# Patient Record
Sex: Female | Born: 1955 | Race: White | Hispanic: No | Smoking: Current every day smoker
Health system: Southern US, Community
[De-identification: ages and names within clinical notes are randomized; demographics above are authoritative.]

## PROBLEM LIST (undated history)

## (undated) DIAGNOSIS — I10 Essential (primary) hypertension: Secondary | ICD-10-CM

## (undated) HISTORY — PX: WRIST FRACTURE SURGERY: SHX121

---

## 2005-06-29 ENCOUNTER — Emergency Department: Payer: Self-pay | Admitting: Emergency Medicine

## 2006-05-02 ENCOUNTER — Ambulatory Visit: Payer: Self-pay

## 2007-07-03 ENCOUNTER — Ambulatory Visit: Payer: Self-pay

## 2008-05-13 ENCOUNTER — Ambulatory Visit: Payer: Self-pay

## 2008-07-07 ENCOUNTER — Ambulatory Visit: Payer: Self-pay

## 2010-05-31 ENCOUNTER — Ambulatory Visit: Payer: Self-pay | Admitting: Family Medicine

## 2011-08-29 ENCOUNTER — Ambulatory Visit: Payer: Self-pay

## 2012-02-06 DIAGNOSIS — I10 Essential (primary) hypertension: Secondary | ICD-10-CM | POA: Insufficient documentation

## 2012-08-29 ENCOUNTER — Ambulatory Visit: Payer: Self-pay | Admitting: Family Medicine

## 2013-09-12 ENCOUNTER — Ambulatory Visit: Payer: Self-pay | Admitting: Family Medicine

## 2014-08-24 ENCOUNTER — Other Ambulatory Visit: Payer: Self-pay | Admitting: Family Medicine

## 2014-08-24 DIAGNOSIS — Z1231 Encounter for screening mammogram for malignant neoplasm of breast: Secondary | ICD-10-CM

## 2014-10-05 ENCOUNTER — Ambulatory Visit
Admission: RE | Admit: 2014-10-05 | Discharge: 2014-10-05 | Disposition: A | Discharge status: Home / Self Care | Payer: No Typology Code available for payment source | Source: Ambulatory Visit | Attending: Family Medicine | Admitting: Family Medicine

## 2014-10-05 DIAGNOSIS — Z1231 Encounter for screening mammogram for malignant neoplasm of breast: Secondary | ICD-10-CM | POA: Insufficient documentation

## 2015-10-20 ENCOUNTER — Ambulatory Visit
Admission: RE | Admit: 2015-10-20 | Discharge: 2015-10-20 | Disposition: A | Discharge status: Home / Self Care | Payer: Self-pay | Source: Ambulatory Visit | Attending: Oncology | Admitting: Oncology

## 2015-10-20 ENCOUNTER — Encounter: Payer: Self-pay | Admitting: *Deleted

## 2015-10-20 ENCOUNTER — Ambulatory Visit: Payer: Self-pay | Attending: Oncology | Admitting: *Deleted

## 2015-10-20 ENCOUNTER — Encounter (INDEPENDENT_AMBULATORY_CARE_PROVIDER_SITE_OTHER): Payer: Self-pay

## 2015-10-20 VITALS — BP 143/94 | HR 73 | Temp 97.9°F | Ht 66.14 in | Wt 159.0 lb

## 2015-10-20 DIAGNOSIS — Z0000 Encounter for general adult medical examination without abnormal findings: Secondary | ICD-10-CM

## 2015-10-20 NOTE — Progress Notes (Signed)
Subjective:     Patient ID: Rebecca Pennington, female   DOB: 1955/05/21, 60 y.o.   MRN: [id number]  HPI   Review of Systems     Objective:   Physical Exam  Pulmonary/Chest: Right breast exhibits no inverted nipple, no mass, no nipple discharge, no skin change and no tenderness. Left breast exhibits no inverted nipple, no mass, no nipple discharge, no skin change and no tenderness. Breasts are symmetrical.    Abdominal: There is no splenomegaly or hepatomegaly.  Genitourinary: Rectal exam shows no mass and no tenderness. No labial fusion. There is no rash, tenderness, lesion or injury on the right labia. There is no rash, tenderness, lesion or injury on the left labia. Cervix exhibits no motion tenderness, no discharge and no friability. Right adnexum displays no mass, no tenderness and no fullness. Left adnexum displays no mass, no tenderness and no fullness. No erythema, tenderness or bleeding in the vagina. No foreign body in the vagina. No signs of injury around the vagina. No vaginal discharge found.       Assessment:     60 year old White [facility]  Patient states her last pap was in 2014.  We have no records of her results.  Clinical breast exam unremarkable.  Taught self breast awareness.  Specimen collected for pap smear since there are no results available.  Blood pressure elevated at  143/94.  She is to recheck her blood pressure at [facility] or [facility], and if remains higher than 140/90 she is to follow-up with her primary care provider.  Hand out on hypertention given to patient.atient has been screened for eligibility.  She does not have any insurance, Medicare or Medicaid.  She also meets financial eligibility.  Hand-out given on the Affordable Care Act.    Plan:     Screening mammogram ordered.  Specimen for pap sent to the lab.  Will follow-up per [facility] protocol.

## 2015-10-20 NOTE — Patient Instructions (Signed)
Human Papillomavirus Human papillomavirus (HPV) is the most common sexually transmitted infection (STI) and is highly contagious. HPV infections cause genital warts and cancers to the outlet of the womb (cervix), birth canal (vagina), opening of the birth canal (vulva), and anus. There are over 100 types of HPV. Unless wartlike lesions are present in the throat or there are genital warts that you can see or feel, HPV usually does not cause symptoms. It is possible to be infected for long periods and pass it on to others without knowing it. CAUSES  HPV is spread from person to person through sexual contact. This includes oral, vaginal, or anal sex. RISK FACTORS  Having unprotected sex. HPV can be spread by oral, vaginal, or anal sex.  Having several sex partners.  Having a sex partner who has other sex partners.  Having or having had another sexually transmitted infection. SIGNS AND SYMPTOMS  Most people carrying HPV do not have any symptoms. If symptoms are present, symptoms may include:  Wartlike lesions in the throat (from having oral sex).  Warts in the infected skin or mucous membranes.  Genital warts that may itch, burn, or bleed.  Genital warts that may be painful or bleed during sexual intercourse. DIAGNOSIS  If wartlike lesions are present in the throat or genital warts are present, your health care provider can usually diagnose HPV by physical examination.   Genital warts are easily seen with the naked eye.  Currently, there is no FDA-approved test to detect HPV in males.  In females, a Pap test can show cells that are infected with HPV.  In females, a scope can be used to view the cervix (colposcopy). A colposcopy can be performed if the pelvic exam or Pap test is abnormal. A sample of tissue may be removed (biopsy) during the colposcopy. TREATMENT  There is no treatment for the virus itself. However, there are treatments for the health problems and symptoms HPV can cause.  Your health care provider will follow you closely after you are treated. This is because the HPV can come back and may need treatment again. Treatment of HPV may include:   Medicines, which may be injected or applied in a cream, lotion, or gel form.  Use of a probe to apply extreme cold (cryotherapy).  Application of an intense beam of light (laser treatment).  Use of a probe to apply extreme heat (electrocautery).  Surgery. HOME CARE INSTRUCTIONS   Take medicines only as directed by your health care provider.  Use over-the-counter creams for itching or irritation as directed by your health care provider.  Keep all follow-up visits as directed by your health care provider. This is important.  Do not touch or scratch the warts.  Do not treat genital warts with medicines used for treating hand warts.  Do not have sex while you are being treated.  Do not douche or use tampons during treatment of HPV.  Tell your sex partner about your infection because he or she may also need treatment.  If you become pregnant, tell your health care provider that you have had HPV. Your health care provider will monitor you closely during pregnancy to be sure your baby is safe.  After treatment, use condoms during sex to prevent future infections.  Have only one sex partner.  Have a sex partner who does not have other sex partners. PREVENTION   Talk to your health care provider about getting the HPV vaccines. These vaccines prevent some HPV infections and cancers.   It is recommended that the vaccine be given to males and females between the ages of 83 and 4 years old. It will not work if you already have HPV, and it is not recommended for pregnant women.  A Pap test is done to screen for cervical cancer in women.  The first Pap test should be done at age 77 years.  Between ages 67 and 65 years, Pap tests are repeated every 2 years.  Beginning at age 35, you are advised to have a Pap test every  3 years as long as your past 3 Pap tests have been normal.  Some women have medical problems that increase the chance of getting cervical cancer. Talk to your health care provider about these problems. It is especially important to talk to your health care provider if a new problem develops soon after your last Pap test. In these cases, your health care provider may recommend more frequent screening and Pap tests.  The above recommendations are the same for women who have or have not gotten the vaccine for HPV.  If you had a hysterectomy for a problem that was not a cancer or a condition that could lead to cancer, then you no longer need Pap tests. However, even if you no longer need a Pap test, a regular exam is a good idea to make sure no other problems are starting.   If you are between the ages of 76 and 57 years and you have had normal Pap tests going back 10 years, you no longer need Pap tests. However, even if you no longer need a Pap test, a regular exam is a good idea to make sure no other problems are starting.  If you have had past treatment for cervical cancer or a condition that could lead to cancer, you need Pap tests and screening for cancer for at least 20 years after your treatment.  If Pap tests have been discontinued, risk factors (such as a new sexual partner)need to be reassessed to determine if screening should be resumed.  Some women may need screenings more often if they are at high risk for cervical cancer. SEEK MEDICAL CARE IF:   The treated skin becomes red, swollen, or painful.  You have a fever.  You feel generally ill.  You feel lumps or pimple-like projections in and around your genital area.  You develop bleeding of the vagina or the treatment area.  You have painful sexual intercourse. MAKE SURE YOU:   Understand these instructions.  Will watch your condition.  Will get help if you are not doing well or get worse.   This information is not  intended to replace advice given to you by your health care provider. Make sure you discuss any questions you have with your health care provider.   Document Released: 04/01/2003 Document Revised: 01/30/2014 Document Reviewed: 04/16/2013 Elsevier Interactive Patient Education 2016 [employer].    Hypertension Hypertension, commonly called high blood pressure, is when the force of blood pumping through your arteries is too strong. Your arteries are the blood vessels that carry blood from your heart throughout your body. A blood pressure reading consists of a higher number over a lower number, such as 110/72. The higher number (systolic) is the pressure inside your arteries when your heart pumps. The lower number (diastolic) is the pressure inside your arteries when your heart relaxes. Ideally you want your blood pressure below 120/80. Hypertension forces your heart to work harder to pump blood. Your  arteries may become narrow or stiff. Having untreated or uncontrolled hypertension can cause heart attack, stroke, kidney disease, and other problems. RISK FACTORS Some risk factors for high blood pressure are controllable. Others are not.  Risk factors you cannot control include:   Race. You may be at higher risk if you are African American.  Age. Risk increases with age.  Gender. Men are at higher risk than women before age 58 years. After age 58, women are at higher risk than men. Risk factors you can control include:  Not getting enough exercise or physical activity.  Being overweight.  Getting too much fat, sugar, calories, or salt in your diet.  Drinking too much alcohol. SIGNS AND SYMPTOMS Hypertension does not usually cause signs or symptoms. Extremely high blood pressure (hypertensive crisis) may cause headache, anxiety, shortness of breath, and nosebleed. DIAGNOSIS To check if you have hypertension, your health care provider will measure your blood pressure while you are seated,  with your arm held at the level of your heart. It should be measured at least twice using the same arm. Certain conditions can cause a difference in blood pressure between your right and left arms. A blood pressure reading that is higher than normal on one occasion does not mean that you need treatment. If it is not clear whether you have high blood pressure, you may be asked to return on a different day to have your blood pressure checked again. Or, you may be asked to monitor your blood pressure at home for 1 or more weeks. TREATMENT Treating high blood pressure includes making lifestyle changes and possibly taking medicine. Living a healthy lifestyle can help lower high blood pressure. You may need to change some of your habits. Lifestyle changes may include:  Following the DASH diet. This diet is high in fruits, vegetables, and whole grains. It is low in salt, red meat, and added sugars.  Keep your sodium intake below 2,300 mg per day.  Getting at least 30-45 minutes of aerobic exercise at least 4 times per week.  Losing weight if necessary.  Not smoking.  Limiting alcoholic beverages.  Learning ways to reduce stress. Your health care provider may prescribe medicine if lifestyle changes are not enough to get your blood pressure under control, and if one of the following is true:  You are 5318-58 years of age and your systolic blood pressure is above 140.  You are 58 years of age or older, and your systolic blood pressure is above 150.  Your diastolic blood pressure is above 90.  You have diabetes, and your systolic blood pressure is over 140 or your diastolic blood pressure is over 90.  You have kidney disease and your blood pressure is above 140/90.  You have heart disease and your blood pressure is above 140/90. Your personal target blood pressure may vary depending on your medical conditions, your age, and other factors. HOME CARE INSTRUCTIONS  Have your blood pressure  rechecked as directed by your health care provider.   Take medicines only as directed by your health care provider. Follow the directions carefully. Blood pressure medicines must be taken as prescribed. The medicine does not work as well when you skip doses. Skipping doses also puts you at risk for problems.  Do not smoke.   Monitor your blood pressure at home as directed by your health care provider. SEEK MEDICAL CARE IF:   You think you are having a reaction to medicines taken.  You have recurrent headaches  or feel dizzy.  You have swelling in your ankles.  You have trouble with your vision. SEEK IMMEDIATE MEDICAL CARE IF:  You develop a severe headache or confusion.  You have unusual weakness, numbness, or feel faint.  You have severe chest or abdominal pain.  You vomit repeatedly.  You have trouble breathing. MAKE SURE YOU:   Understand these instructions.  Will watch your condition.  Will get help right away if you are not doing well or get worse.   This information is not intended to replace advice given to you by your health care provider. Make sure you discuss any questions you have with your health care provider.   Document Released: 01/09/2005 Document Revised: 05/26/2014 Document Reviewed: 11/01/2012 Elsevier Interactive Patient Education [employer]

## 2015-10-22 LAB — PAP LB AND HPV HIGH-RISK
HPV, high-risk: NEGATIVE
PAP SMEAR COMMENT: 0

## 2015-10-29 ENCOUNTER — Encounter: Payer: Self-pay | Admitting: *Deleted

## 2015-10-29 NOTE — Progress Notes (Signed)
Letter mailed to inform patient of her normal mammogram and pap smear.  Follow up with screening mammogram in one year and next pap in 5 years.  HSIS to Christy. 

## 2016-11-13 ENCOUNTER — Ambulatory Visit
Admission: RE | Admit: 2016-11-13 | Discharge: 2016-11-13 | Disposition: A | Discharge status: Home / Self Care | Payer: Self-pay | Source: Ambulatory Visit | Attending: Oncology | Admitting: Oncology

## 2016-11-13 ENCOUNTER — Encounter: Payer: Self-pay | Admitting: *Deleted

## 2016-11-13 ENCOUNTER — Ambulatory Visit: Payer: Self-pay | Attending: Oncology | Admitting: *Deleted

## 2016-11-13 ENCOUNTER — Encounter (INDEPENDENT_AMBULATORY_CARE_PROVIDER_SITE_OTHER): Payer: Self-pay

## 2016-11-13 VITALS — BP 120/87 | HR 80 | Temp 97.8°F | Ht 67.0 in | Wt 164.0 lb

## 2016-11-13 DIAGNOSIS — Z0000 Encounter for general adult medical examination without abnormal findings: Secondary | ICD-10-CM

## 2016-11-13 NOTE — Patient Instructions (Signed)
Gave patient hand-out, Women Staying Healthy, Active and Well from [facility], with education on breast health, pap smears, heart and colon health. 

## 2016-11-13 NOTE — Progress Notes (Signed)
Subjective:     Patient ID: Rowan BlaseElizabeth M Warrior, female   DOB: 11-06-1955, 61 y.o.   MRN: [id number]  HPI   Review of Systems     Objective:   Physical Exam  Pulmonary/Chest: Right breast exhibits no inverted nipple, no mass, no nipple discharge, no skin change and no tenderness. Left breast exhibits no inverted nipple, no mass, no nipple discharge, no skin change and no tenderness. Breasts are asymmetrical.    Right breast 1 cup size larger than the left       Assessment:     61 year [facility] White [facility]  Clinical breast exam unremarkable.  Taught self breast awareness.  Last pap on 10/20/15 was -/-.  Next pap due in 2022.  Patient has been screened for eligibility.  She does not have any insurance, Medicare or Medicaid.  She also meets financial eligibility.  Hand-out given on the Affordable Care Act.    Plan:     Screening mammogram ordered.  Will follow-up per [facility] protocol.

## 2016-11-29 ENCOUNTER — Encounter: Payer: Self-pay | Admitting: *Deleted

## 2016-11-29 NOTE — Progress Notes (Signed)
Letter mailed from [facility] to inform patient of her normal mammogram results.  Patient is to follow-up with annual screening in one year.  HSIS to Christy. 

## 2017-12-17 ENCOUNTER — Ambulatory Visit
Admission: RE | Admit: 2017-12-17 | Discharge: 2017-12-17 | Disposition: A | Discharge status: Home / Self Care | Payer: Self-pay | Source: Ambulatory Visit | Attending: Oncology | Admitting: Oncology

## 2017-12-17 ENCOUNTER — Encounter (INDEPENDENT_AMBULATORY_CARE_PROVIDER_SITE_OTHER): Payer: Self-pay

## 2017-12-17 ENCOUNTER — Ambulatory Visit: Payer: Self-pay | Attending: Oncology

## 2017-12-17 VITALS — BP 132/91 | HR 99 | Temp 97.8°F | Ht 67.0 in | Wt 175.0 lb

## 2017-12-17 DIAGNOSIS — Z0000 Encounter for general adult medical examination without abnormal findings: Secondary | ICD-10-CM | POA: Insufficient documentation

## 2017-12-17 NOTE — Progress Notes (Signed)
  Subjective:     Patient ID: Rowan BlaseElizabeth M Goodreau, female   DOB: 1955-12-30, 62 y.o.   MRN: [id number]  HPI   Review of Systems     Objective:   Physical Exam  Pulmonary/Chest: Right breast exhibits no inverted nipple, no mass, no nipple discharge, no skin change and no tenderness. Left breast exhibits no inverted nipple, no mass, no nipple discharge, no skin change and no tenderness. Breasts are asymmetrical.  Right breast larger than left       Assessment:     62 year old patient presents for [facility] clinic visit.  Patient screened, and meets [facility] eligibility.  Patient does not have insurance, Medicare or Medicaid.  Handout given on Affordable Care Act.  Instructed patient on breast self awareness using teach back method.  Clinical breast exam unremarkable.  No mass or lump palpated.  Recheck blood pressure 132/91.  Patient will monitor.   States she is going for physical at [facility], and will discuss with provider.  Reports she has had a stressful morning at work, having to learn a new process.     Plan:     Sent for bilateral screening mammogram.

## 2017-12-18 NOTE — Progress Notes (Signed)
Letter mailed from [facility] to notify of normal mammogram results.  Patient to return in one year for annual screening.  Copy to HSIS. 

## 2019-04-28 ENCOUNTER — Ambulatory Visit: Payer: Self-pay | Attending: Internal Medicine

## 2019-04-28 DIAGNOSIS — Z23 Encounter for immunization: Secondary | ICD-10-CM

## 2019-04-28 NOTE — Progress Notes (Signed)
   Covid-19 Vaccination Clinic  Name:  Rebecca Pennington    MRN: [id number] DOB: Oct 09, 1955  04/28/2019  Rebecca Pennington was observed post Covid-19 immunization for 15 minutes without incident. She was provided with Vaccine Information Sheet and instruction to access the V-Safe system.   Rebecca Pennington was instructed to call 911 with any severe reactions post vaccine: Marland Kitchen Difficulty breathing  . Swelling of face and throat  . A fast heartbeat  . A bad rash all over body  . Dizziness and weakness   Immunizations Administered    Name Date Dose VIS Date Route   Pfizer COVID-19 Vaccine 04/28/2019 11:42 AM 0.3 mL 01/03/2019 Intramuscular   Manufacturer: [employer], [employer]   Lot: [id number]   NDC: [address]-7

## 2019-05-21 ENCOUNTER — Ambulatory Visit: Payer: Self-pay | Attending: Internal Medicine

## 2019-05-21 DIAGNOSIS — Z23 Encounter for immunization: Secondary | ICD-10-CM

## 2019-05-21 NOTE — Progress Notes (Signed)
   Covid-19 Vaccination Clinic  Name:  Rebecca Pennington    MRN: [id number] DOB: [birth year]  05/21/2019  Rebecca Pennington was observed post Covid-19 immunization for 15 minutes without incident. She was provided with Vaccine Information Sheet and instruction to access the V-Safe system.   Rebecca Pennington was instructed to call 911 with any severe reactions post vaccine: Marland Kitchen Difficulty breathing  . Swelling of face and throat  . A fast heartbeat  . A bad rash all over body  . Dizziness and weakness   Immunizations Administered    Name Date Dose VIS Date Route   Pfizer COVID-19 Vaccine 05/21/2019 11:21 AM 0.3 mL 03/19/2018 Intramuscular   Manufacturer: [employer], [employer]   Lot: [id number]   NDC: [address]-0

## 2020-06-16 ENCOUNTER — Ambulatory Visit: Payer: Self-pay | Attending: Oncology | Admitting: *Deleted

## 2020-06-16 ENCOUNTER — Other Ambulatory Visit: Payer: Self-pay

## 2020-06-16 ENCOUNTER — Encounter: Payer: Self-pay | Admitting: *Deleted

## 2020-06-16 ENCOUNTER — Ambulatory Visit
Admission: RE | Admit: 2020-06-16 | Discharge: 2020-06-16 | Disposition: A | Discharge status: Home / Self Care | Payer: Self-pay | Source: Ambulatory Visit | Attending: Oncology | Admitting: Oncology

## 2020-06-16 VITALS — BP 139/83 | HR 72 | Temp 97.7°F | Resp 16 | Wt 150.2 lb

## 2020-06-16 DIAGNOSIS — Z0000 Encounter for general adult medical examination without abnormal findings: Secondary | ICD-10-CM | POA: Insufficient documentation

## 2020-06-16 NOTE — Progress Notes (Signed)
  Subjective:     Patient ID: Rebecca Pennington, female   DOB: 08-19-1955, 65 y.o.   MRN: [id number]  HPI   Review of Systems     Objective:   Physical Exam Chest:  Breasts:     Right: No swelling, bleeding, inverted nipple, mass, nipple discharge, skin change, tenderness, axillary adenopathy or supraclavicular adenopathy.     Left: No swelling, bleeding, inverted nipple, mass, nipple discharge, skin change, tenderness, axillary adenopathy or supraclavicular adenopathy.    Abdominal:     Palpations: There is no hepatomegaly or splenomegaly.  Genitourinary:    Exam position: Lithotomy position.     Labia:        Right: No rash, tenderness, lesion or injury.        Left: No rash, tenderness, lesion or injury.      Urethra: No prolapse or urethral pain.     Vagina: No signs of injury and foreign body. No vaginal discharge, erythema, tenderness, bleeding, lesions or prolapsed vaginal walls.     Cervix: No cervical motion tenderness, discharge, friability, lesion, erythema, cervical bleeding or eversion.     Uterus: Not deviated, not enlarged, not fixed, not tender and no uterine prolapse.      Adnexa:        Right: No mass.         Left: No mass.       Rectum: No mass.  Lymphadenopathy:     Upper Body:     Right upper body: No supraclavicular or axillary adenopathy.     Left upper body: No supraclavicular or axillary adenopathy.        Assessment:     65 year old female returns to [facility] for annual screening.  Clinical breast exam unremarkable.  Taught self breast awareness.  Specimen collected for pap smear without difficulty.  Patient has been screened for eligibility.  She does not have any insurance, Medicare or Medicaid.  She also meets financial eligibility.   Risk Assessment    Risk Scores      06/16/2020 12/17/2017   Last edited by: Scarlett Presto, RN [place], Freada Bergeron, CMA   5-year risk: 1.7 % 1.6 %   Lifetime risk: 7.4 % 8.1 %            Plan:     Screening  mammogram ordered.  Specimen for pap sent to the lab.  Will follow up per [facility] protocol.

## 2020-06-16 NOTE — Patient Instructions (Signed)
Gave patient hand-out, Women Staying Healthy, Active and Well from [facility], with education on breast health, pap smears, heart and colon health. 

## 2020-06-19 LAB — IGP, APTIMA HPV: HPV Aptima: NEGATIVE

## 2020-06-23 ENCOUNTER — Encounter: Payer: Self-pay | Admitting: *Deleted

## 2020-12-16 ENCOUNTER — Emergency Department: Payer: Self-pay

## 2020-12-16 ENCOUNTER — Emergency Department
Admission: EM | Admit: 2020-12-16 | Discharge: 2020-12-16 | Disposition: A | Discharge status: Home / Self Care | Payer: Self-pay | Attending: Emergency Medicine | Admitting: Emergency Medicine

## 2020-12-16 ENCOUNTER — Other Ambulatory Visit: Payer: Self-pay

## 2020-12-16 ENCOUNTER — Encounter: Payer: Self-pay | Admitting: Emergency Medicine

## 2020-12-16 DIAGNOSIS — I10 Essential (primary) hypertension: Secondary | ICD-10-CM | POA: Insufficient documentation

## 2020-12-16 DIAGNOSIS — S82841A Displaced bimalleolar fracture of right lower leg, initial encounter for closed fracture: Secondary | ICD-10-CM | POA: Insufficient documentation

## 2020-12-16 DIAGNOSIS — F17210 Nicotine dependence, cigarettes, uncomplicated: Secondary | ICD-10-CM | POA: Insufficient documentation

## 2020-12-16 DIAGNOSIS — M25571 Pain in right ankle and joints of right foot: Secondary | ICD-10-CM | POA: Insufficient documentation

## 2020-12-16 DIAGNOSIS — W541XXA Struck by dog, initial encounter: Secondary | ICD-10-CM | POA: Insufficient documentation

## 2020-12-16 HISTORY — DX: Essential (primary) hypertension: I10

## 2020-12-16 MED ORDER — OXYCODONE-ACETAMINOPHEN 5-325 MG PO TABS
1.0000 | ORAL_TABLET | Freq: Once | ORAL | Status: AC
  Administered 2020-12-16: 1 via ORAL
  Filled 2020-12-16: qty 1

## 2020-12-16 MED ORDER — OXYCODONE-ACETAMINOPHEN 5-325 MG PO TABS: 1.0000 | ORAL_TABLET | ORAL | 0 refills | Status: DC | PRN

## 2020-12-16 MED ORDER — OXYCODONE-ACETAMINOPHEN 5-325 MG PO TABS: 1.0000 | ORAL_TABLET | ORAL | 0 refills | Status: AC | PRN

## 2020-12-16 NOTE — ED Provider Notes (Signed)
[facility] Emergency Department Provider Note   ____________________________________________   Event Date/Time   First MD Initiated Contact with Patient 12/16/20 1405     (approximate)  I have reviewed the triage vital signs and the nursing notes.   HISTORY  Chief Complaint Ankle Pain    HPI Rebecca Pennington is a 65 y.o. female with past medical history of hypertension who presents to the ED complaining of ankle pain.  Patient reports that she tripped and fell over her dog just prior to arrival, resulting in inversion of her right ankle.  She felt a pop and had immediate onset of severe pain, has been unable to bear weight on her right foot since then.  She denies hitting her head or losing consciousness, denies any injuries to her left lower extremity or her bilateral upper extremities.  She denies any prior injuries to this right ankle, has noticed significant swelling since the injury.  She does not take any blood thinners.        Past Medical History:  Diagnosis Date   Hypertension     There are no problems to display for this patient.   History reviewed. No pertinent surgical history.  Prior to Admission medications   Medication Sig Start Date End Date Taking? Authorizing Provider  oxyCODONE-acetaminophen (PERCOCET) 5-325 MG tablet Take 1 tablet by mouth every 4 (four) hours as needed for severe pain. 12/16/20 12/16/21  Chesley Noon, MD    Allergies Sulfa antibiotics  Family History  Problem Relation Age of Onset   Breast cancer Neg Hx     Social History Social History   Tobacco Use   Smoking status: Every Day    Types: Cigarettes   Smokeless tobacco: Never  Substance Use Topics   Alcohol use: Yes   Drug use: Not Currently    Review of Systems  Constitutional: No fever/chills Eyes: No visual changes. ENT: No sore throat. Cardiovascular: Denies chest pain. Respiratory: Denies shortness of breath. Gastrointestinal:  No abdominal pain.  No nausea, no vomiting.  No diarrhea.  No constipation. Genitourinary: Negative for dysuria. Musculoskeletal: Negative for back pain.  Positive for right ankle pain. Skin: Negative for rash. Neurological: Negative for headaches, focal weakness or numbness.  ____________________________________________   PHYSICAL EXAM:  VITAL SIGNS: ED Triage Vitals  Enc Vitals Group     BP 12/16/20 1305 117/66     Pulse Rate 12/16/20 1305 78     Resp 12/16/20 1305 16     Temp 12/16/20 1305 97.8 F (36.6 C)     Temp Source 12/16/20 1305 Oral     SpO2 12/16/20 1305 100 %     Weight 12/16/20 1257 150 lb 2.1 oz (68.1 kg)     Height 12/16/20 1257 5\' 7"  (1.702 m)     Head Circumference --      Peak Flow --      Pain Score 12/16/20 1257 5     Pain Loc --      Pain Edu? --      Excl. in GC? --     Constitutional: Alert and oriented. Eyes: Conjunctivae are normal. Head: Atraumatic. Nose: No congestion/rhinnorhea. Mouth/Throat: Mucous membranes are moist. Neck: Normal ROM Cardiovascular: Normal rate, regular rhythm. Grossly normal heart sounds.  2+ DP pulses bilaterally. Respiratory: Normal respiratory effort.  No retractions. Lungs CTAB. Gastrointestinal: Soft and nontender. No distention. Genitourinary: deferred Musculoskeletal: Edema and tenderness noted to right ankle, especially affecting the lateral malleolus, with no obvious deformity.  No tenderness to palpation over left foot.  No tenderness to palpation noted to bilateral knees, left ankle or foot.  No lacerations noted. Neurologic:  Normal speech and language. No gross focal neurologic deficits are appreciated. Skin:  Skin is warm, dry and intact. No rash noted. Psychiatric: Mood and affect are normal. Speech and behavior are normal.  ____________________________________________   LABS (all labs ordered are listed, but only abnormal results are displayed)  Labs Reviewed - No data to  display   PROCEDURES  Procedure(s) performed (including Critical Care):  .Splint Application  Date/Time: 12/16/2020 3:13 PM Performed by: Chesley Noon, MD Authorized by: Chesley Noon, MD   Consent:    Consent obtained:  Verbal   Consent given by:  Patient   Risks, benefits, and alternatives were discussed: yes     Risks discussed:  Discoloration, numbness, pain and swelling   Alternatives discussed:  Referral Universal protocol:    Patient identity confirmed:  Verbally with patient and arm band Pre-procedure details:    Distal neurologic exam:  Normal   Distal perfusion: distal pulses strong and brisk capillary refill   Procedure details:    Location:  Ankle   Ankle location:  R ankle   Splint type:  Ankle stirrup and short leg   Supplies:  Cotton padding, fiberglass and elastic bandage   Attestation: Splint applied and adjusted personally by me   Post-procedure details:    Distal neurologic exam:  Normal   Distal perfusion: brisk capillary refill     Procedure completion:  Tolerated well, no immediate complications   Post-procedure imaging: not applicable     ____________________________________________   INITIAL IMPRESSION / ASSESSMENT AND PLAN / ED COURSE      65 year old female with past medical history of hypertension presents to the ED after tripping and falling over her dog, now has significant pain and swelling to her right ankle.  She is neurovascularly intact to her distal right lower extremity, does have significant edema and ecchymosis to the area of her right malleolus with associated tenderness.  No tenderness noted to right foot.  X-rays of right ankle reviewed by me and show bimalleolar fracture with significant distal fibular fracture and a smaller medial malleolus fracture.  There is slight disruption of the ankle mortise.  X-rays reviewed with Dr. Joice Lofts of orthopedics, who recommends splinting in neutral position.  Given patient has minimal medical  problems, will be able to utilize crutches, and has help at home, she is appropriate for outpatient management and follow-up in orthopedic clinic.  Splint applied by me and patient is significantly more comfortable with this in place.  She is appropriate for outpatient management and follow-up with orthopedics, was provided with crutches and short course of pain medication.  She was counseled to return to the ED for new worsening symptoms, patient agrees with plan.      ____________________________________________   FINAL CLINICAL IMPRESSION(S) / ED DIAGNOSES  Final diagnoses:  Closed bimalleolar fracture of right ankle, initial encounter     ED Discharge Orders          Ordered    oxyCODONE-acetaminophen (PERCOCET) 5-325 MG tablet  Every 4 hours PRN,   Status:  Discontinued        12/16/20 1505    oxyCODONE-acetaminophen (PERCOCET) 5-325 MG tablet  Every 4 hours PRN        12/16/20 1509             Note:  This document was prepared using Dragon  voice recognition software and may include unintentional dictation errors.    Chesley Noon, MD 12/16/20 [phone]

## 2020-12-16 NOTE — ED Triage Notes (Signed)
Pt comes into the ED via POV c/o right ankle pain after tripping over her dog today.  Pt in NAD at this time with even and unlabored respirations.

## 2020-12-16 NOTE — ED Notes (Signed)
Ice pack applied to right ankle.

## 2020-12-20 DIAGNOSIS — S82841A Displaced bimalleolar fracture of right lower leg, initial encounter for closed fracture: Secondary | ICD-10-CM | POA: Insufficient documentation

## 2020-12-21 ENCOUNTER — Other Ambulatory Visit: Payer: Self-pay | Admitting: Surgery

## 2020-12-22 ENCOUNTER — Other Ambulatory Visit: Payer: Self-pay

## 2020-12-23 ENCOUNTER — Encounter: Admission: RE | Payer: Self-pay | Source: Home / Self Care

## 2020-12-23 ENCOUNTER — Ambulatory Visit: Admission: RE | Admit: 2020-12-23 | Payer: BLUE CROSS/BLUE SHIELD | Source: Home / Self Care | Admitting: Surgery

## 2020-12-23 SURGERY — OPEN REDUCTION INTERNAL FIXATION (ORIF) ANKLE FRACTURE
Anesthesia: Choice | Site: Ankle | Laterality: Right

## 2022-03-24 IMAGING — CR DG ANKLE COMPLETE 3+V*R*
1 series · 3 of 3 positions shown · non-contrast
Comparison: None.

CLINICAL DATA: Status post fall today.

EXAM:
RIGHT ANKLE - COMPLETE 3+ VIEW

[Series 1: dg ankle complete right · 0.14mm/px · 3 of 3 slices shown]
[im 1/3]
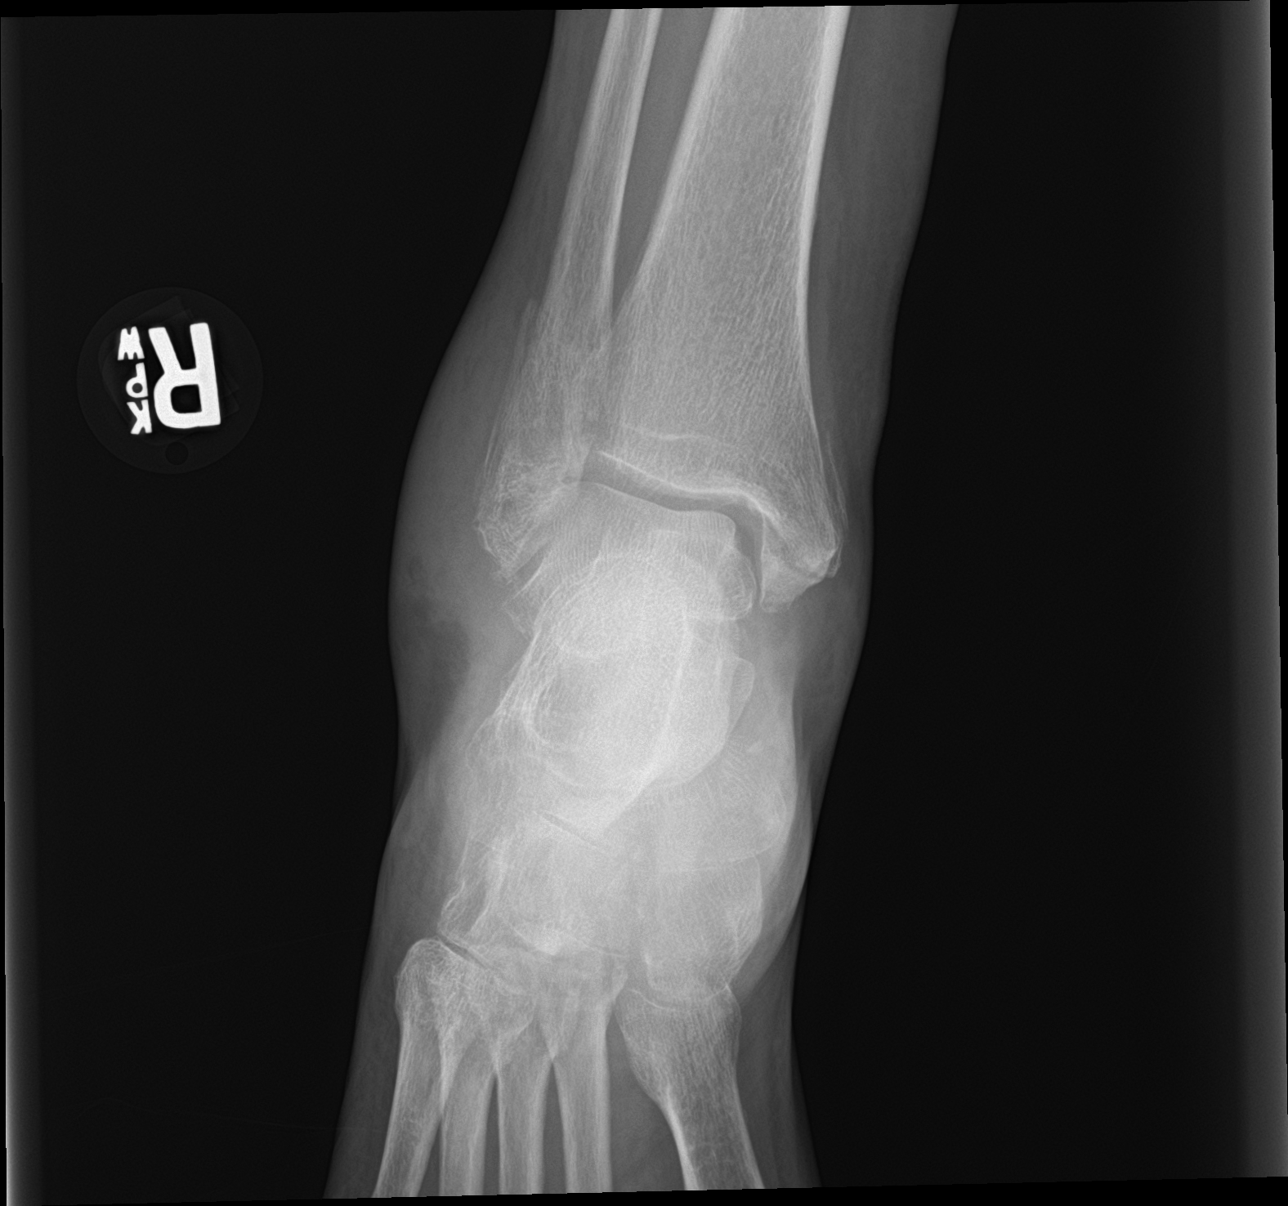
[im 2/3]
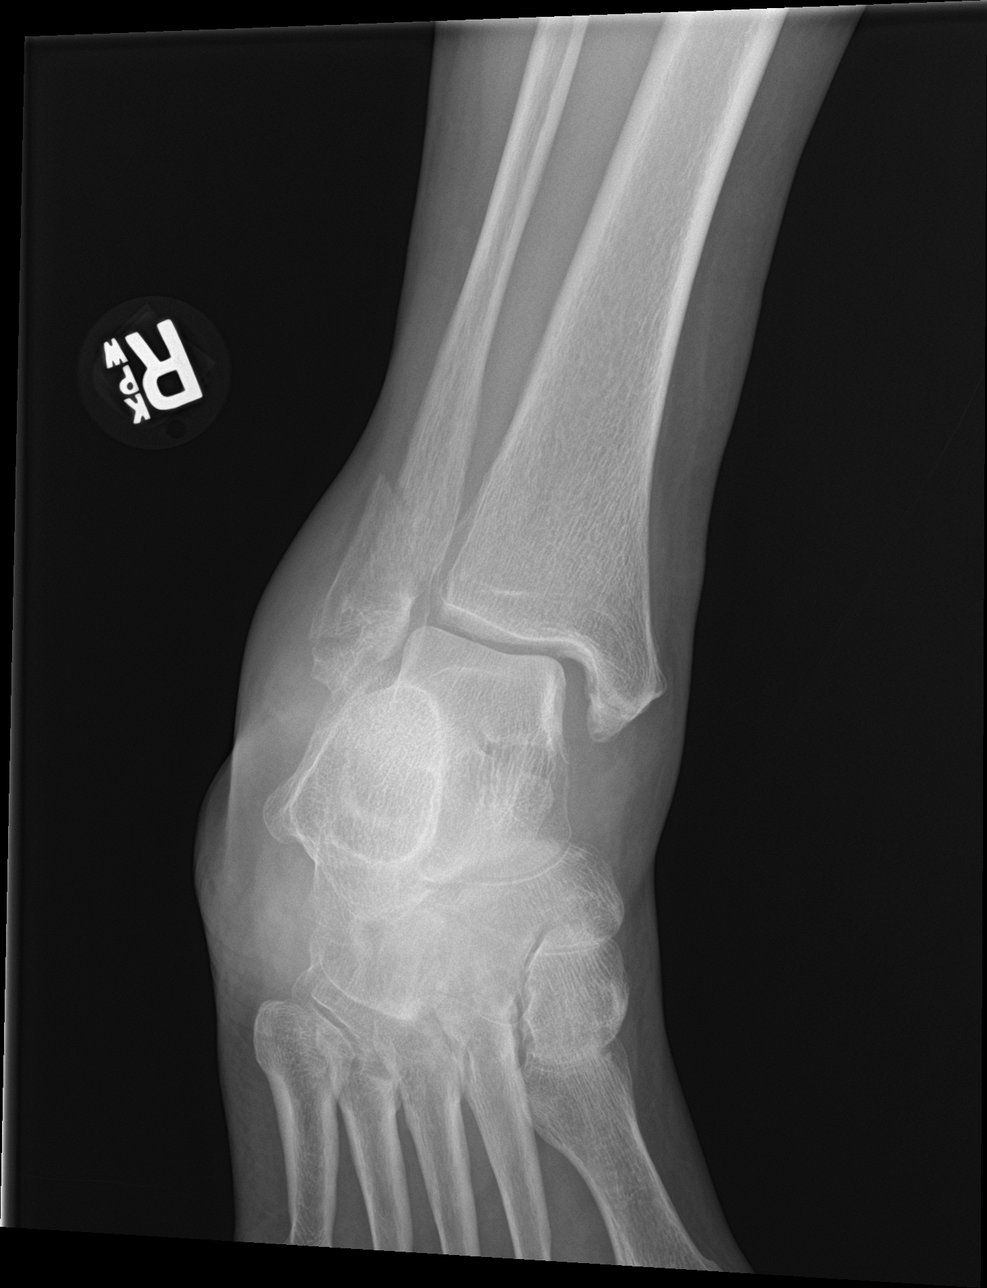
[im 3/3]
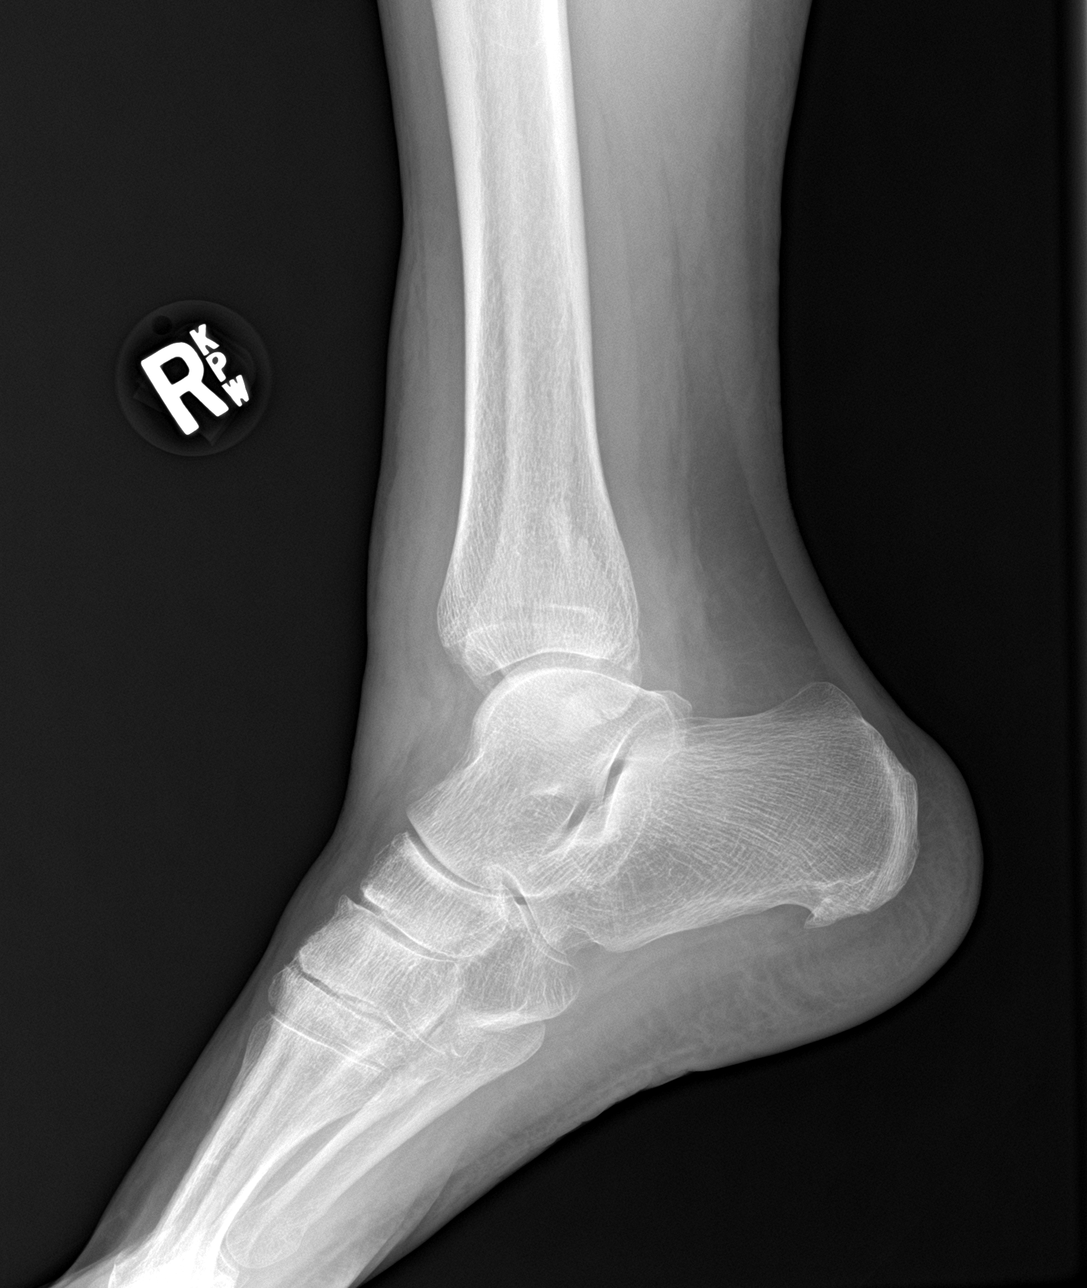

[3 of 3 positions shown; findings below may reference images not displayed]

FINDINGS: Comminuted displaced fracture of the distal fibula is identified.
Fracture of the medial malleolus is identified. There is ankle
mortise disruption. Small plantar calcaneal spur is noted. Soft
tissue noted around the ankle.
IMPRESSION: 1. Comminuted displaced fracture of the distal fibula.
2. Fracture of the medial malleolus.
3. Circumferential ankle mortise disruption.

## 2022-05-15 ENCOUNTER — Other Ambulatory Visit: Payer: Self-pay | Admitting: Physician Assistant

## 2022-05-15 DIAGNOSIS — Z1231 Encounter for screening mammogram for malignant neoplasm of breast: Secondary | ICD-10-CM

## 2022-06-07 ENCOUNTER — Ambulatory Visit
Admission: RE | Admit: 2022-06-07 | Discharge: 2022-06-07 | Disposition: A | Discharge status: Home / Self Care | Payer: Medicare HMO | Source: Ambulatory Visit | Attending: Physician Assistant | Admitting: Physician Assistant

## 2022-06-07 DIAGNOSIS — Z1231 Encounter for screening mammogram for malignant neoplasm of breast: Secondary | ICD-10-CM | POA: Diagnosis present

## 2022-06-28 ENCOUNTER — Emergency Department
Admission: EM | Admit: 2022-06-28 | Discharge: 2022-06-28 | Disposition: A | Discharge status: Home / Self Care | Payer: Medicare HMO | Attending: Emergency Medicine | Admitting: Emergency Medicine

## 2022-06-28 ENCOUNTER — Other Ambulatory Visit: Payer: Self-pay

## 2022-06-28 DIAGNOSIS — F419 Anxiety disorder, unspecified: Secondary | ICD-10-CM | POA: Insufficient documentation

## 2022-06-28 DIAGNOSIS — E876 Hypokalemia: Secondary | ICD-10-CM | POA: Insufficient documentation

## 2022-06-28 DIAGNOSIS — I10 Essential (primary) hypertension: Secondary | ICD-10-CM | POA: Diagnosis present

## 2022-06-28 LAB — CBC WITH DIFFERENTIAL/PLATELET
Abs Immature Granulocytes: 0.04 10*3/uL (ref 0.00–0.07)
Basophils Absolute: 0.1 10*3/uL (ref 0.0–0.1)
Basophils Relative: 1 %
Eosinophils Absolute: 0 10*3/uL (ref 0.0–0.5)
Eosinophils Relative: 0 %
HCT: 39.2 % (ref 36.0–46.0)
Hemoglobin: 13.5 g/dL (ref 12.0–15.0)
Immature Granulocytes: 0 %
Lymphocytes Relative: 13 %
Lymphs Abs: 1.7 10*3/uL (ref 0.7–4.0)
MCH: 31.6 pg (ref 26.0–34.0)
MCHC: 34.4 g/dL (ref 30.0–36.0)
MCV: 91.8 fL (ref 80.0–100.0)
Monocytes Absolute: 0.6 10*3/uL (ref 0.1–1.0)
Monocytes Relative: 5 %
Neutro Abs: 10.7 10*3/uL — ABNORMAL HIGH (ref 1.7–7.7)
Neutrophils Relative %: 81 %
Platelets: 276 10*3/uL (ref 150–400)
RBC: 4.27 MIL/uL (ref 3.87–5.11)
RDW: 11.9 % (ref 11.5–15.5)
WBC: 13.2 10*3/uL — ABNORMAL HIGH (ref 4.0–10.5)
nRBC: 0 % (ref 0.0–0.2)

## 2022-06-28 LAB — COMPREHENSIVE METABOLIC PANEL
ALT: 16 U/L (ref 0–44)
AST: 16 U/L (ref 15–41)
Albumin: 4.7 g/dL (ref 3.5–5.0)
Alkaline Phosphatase: 58 U/L (ref 38–126)
Anion gap: 15 (ref 5–15)
BUN: 14 mg/dL (ref 8–23)
CO2: 24 mmol/L (ref 22–32)
Calcium: 9.4 mg/dL (ref 8.9–10.3)
Chloride: 96 mmol/L — ABNORMAL LOW (ref 98–111)
Creatinine, Ser: 0.53 mg/dL (ref 0.44–1.00)
GFR, Estimated: >60 mL/min (ref >60)
Glucose, Bld: 121 mg/dL — ABNORMAL HIGH (ref 70–99)
Potassium: 3 mmol/L — ABNORMAL LOW (ref 3.5–5.1)
Sodium: 135 mmol/L (ref 135–145)
Total Bilirubin: 0.5 mg/dL (ref 0.3–1.2)
Total Protein: 7.6 g/dL (ref 6.5–8.1)

## 2022-06-28 LAB — URINALYSIS, ROUTINE W REFLEX MICROSCOPIC
Bacteria, UA: NONE SEEN
Bilirubin Urine: NEGATIVE
Glucose, UA: NEGATIVE mg/dL
Ketones, ur: NEGATIVE mg/dL
Nitrite: NEGATIVE
Protein, ur: NEGATIVE mg/dL
Specific Gravity, Urine: 1.009 (ref 1.005–1.030)
pH: 6 (ref 5.0–8.0)

## 2022-06-28 LAB — TROPONIN I (HIGH SENSITIVITY): Troponin I (High Sensitivity): 5 ng/L (ref <18)

## 2022-06-28 MED ORDER — CLONIDINE HCL 0.1 MG PO TABS
0.1000 mg | ORAL_TABLET | Freq: Once | ORAL | Status: AC
  Administered 2022-06-28: 0.1 mg via ORAL
  Filled 2022-06-28: qty 1

## 2022-06-28 MED ORDER — POTASSIUM CHLORIDE CRYS ER 20 MEQ PO TBCR: 20.0000 meq | EXTENDED_RELEASE_TABLET | Freq: Two times a day (BID) | ORAL | 0 refills | Status: DC

## 2022-06-28 NOTE — ED Provider Notes (Signed)
[facility] Provider Note  Patient Contact: 8:28 PM (approximate)   History   Hypertension   HPI  Rebecca Pennington is a 67 y.o. female who presents the emergency department complaining of hypertension.  Patient has a history of hypertension, takes HCTZ daily for same.  Patient states that her primary care told her that she had "enlarged heart" has referred her to cardiology that she has not seen cardiology at this time.  Patient states that this has been making her very anxious and she has been checking her blood pressure multiple times a day.  She started to notice a rise in her blood pressure today and over multiple rechecks it is risen.  She arrives with a pressure of 170/85.  No headache, visual changes, chest pain, shortness of breath, abdominal pain.  No other complaints at this time.     Physical Exam   Triage Vital Signs: ED Triage Vitals  Enc Vitals Group     BP 06/28/22 1838 (!) 170/85     Pulse Rate 06/28/22 1838 (!) 104     Resp 06/28/22 1838 18     Temp 06/28/22 1838 98.3 F (36.8 C)     Temp src --      SpO2 06/28/22 1838 98 %     Weight 06/28/22 1839 137 lb (62.1 kg)     Height 06/28/22 1839 5\' 5"  (1.651 m)     Head Circumference --      Peak Flow --      Pain Score 06/28/22 1839 0     Pain Loc --      Pain Edu? --      Excl. in GC? --     Most recent vital signs: Vitals:   06/28/22 2253 06/28/22 2254  BP: (!) 169/82   Pulse: 95 92  Resp: 18 18  Temp:    SpO2: 98% 98%     General: Alert and in no acute distress. Eyes:  PERRL. EOMI.  Neck: No stridor. No cervical spine tenderness to palpation.  Cardiovascular:  Good peripheral perfusion.  No appreciable murmurs, rubs, gallops Respiratory: Normal respiratory effort without tachypnea or retractions. Lungs CTAB. Good air entry to the bases with no decreased or absent breath sounds. Musculoskeletal: Full range of motion to all extremities.  Neurologic:  No gross focal  neurologic deficits are appreciated.  Skin:   No rash noted Other:   ED Results / Procedures / Treatments   Labs (all labs ordered are listed, but only abnormal results are displayed) Labs Reviewed  CBC WITH DIFFERENTIAL/PLATELET - Abnormal; Notable for the following components:      Result Value   WBC 13.2 (*)    Neutro Abs 10.7 (*)    All other components within normal limits  COMPREHENSIVE METABOLIC PANEL - Abnormal; Notable for the following components:   Potassium 3.0 (*)    Chloride 96 (*)    Glucose, Bld 121 (*)    All other components within normal limits  URINALYSIS, ROUTINE W REFLEX MICROSCOPIC - Abnormal; Notable for the following components:   Color, Urine STRAW (*)    APPearance CLEAR (*)    Hgb urine dipstick MODERATE (*)    Leukocytes,Ua TRACE (*)    All other components within normal limits  TROPONIN I (HIGH SENSITIVITY)     EKG  ED ECG REPORT I, Delorise Royals Bryley Chrisman,  personally viewed and interpreted this ECG.   Date: 06/28/2022  EKG Time: 2055 hrs.  Rate:  75 bpm  Rhythm: there are no previous tracings available for comparison, normal sinus rhythm, incomplete right bundle branch block  Axis: Normal axis  Intervals: Incomplete right bundle branch block with RSR pattern but no widening of the QRS  ST&T Change: No gross ST elevation or depression noted  Normal sinus rhythm.  No STEMI.  Biphasic P wave consistent with right atrial enlargement.  Incomplete right bundle branch block with RSR pattern but no widening of the QRS.    RADIOLOGY    No results found.  PROCEDURES:  Critical Care performed: No  Procedures   MEDICATIONS ORDERED IN ED: Medications  cloNIDine (CATAPRES) tablet 0.1 mg (0.1 mg Oral Given 06/28/22 2040)     IMPRESSION / MDM / ASSESSMENT AND PLAN / ED COURSE  I reviewed the triage vital signs and the nursing notes.                                 Differential diagnosis includes, but is not limited to, hypertension,  infectious process, kidney injury, ACS   Patient's presentation is most consistent with acute presentation with potential threat to life or bodily function.   Patient's diagnosis is consistent with hypertension, hypokalemia.  Patient presents emergency department concern for rising blood pressure.  Patient had noted a slightly elevated blood pressure earlier today and had repetitively checked it and watch the pressure climb from the 130s into the 170s.  Patient has no other symptoms or complaints.  Overall exam was reassuring in the room.  She did arrive with a blood pressure of 170s systolic.  Patient was given Catapres with improvement into the systolic 140s and 150s.  Labs are reassuring.  Slight elevation in her white blood cell count with left shift but she has no infectious symptoms.  Did not advise the patient of this finding.  Patient has mild hypokalemia which is not surprising given the fact that she is on HCTZ.  Will supplement orally with potassium.  No other concerning findings, patient is largely asymptomatic.  Recommend following up with primary care or cardiology as needed.  Recommend that patient keep a blood pressure journal and if findings are consistently elevated at that time would consider changing or adding medications.  Patient is agreeable with this plan.  Concerning signs and symptoms and return precautions are discussed with the patient at this time.. Patient is given ED precautions to return to the ED for any worsening or new symptoms.     FINAL CLINICAL IMPRESSION(S) / ED DIAGNOSES   Final diagnoses:  Primary hypertension     Rx / DC Orders   ED Discharge Orders          Ordered    potassium chloride SA (KLOR-CON M) 20 MEQ tablet  2 times daily        06/28/22 2242             Note:  This document was prepared using Dragon voice recognition software and may include unintentional dictation errors.   Racheal Patches, PA-C 06/29/22 0013    Georga Hacking, MD 06/29/22 [phone]

## 2022-06-28 NOTE — ED Triage Notes (Signed)
Pt to ED for HTN, hx of same, takes medications. Denies h/a, cp.

## 2022-07-20 ENCOUNTER — Telehealth: Payer: Self-pay

## 2022-07-20 ENCOUNTER — Other Ambulatory Visit: Payer: Self-pay | Admitting: *Deleted

## 2022-07-20 ENCOUNTER — Telehealth: Payer: Self-pay | Admitting: *Deleted

## 2022-07-20 DIAGNOSIS — Z1211 Encounter for screening for malignant neoplasm of colon: Secondary | ICD-10-CM

## 2022-07-20 DIAGNOSIS — R195 Other fecal abnormalities: Secondary | ICD-10-CM

## 2022-07-20 MED ORDER — NA SULFATE-K SULFATE-MG SULF 17.5-3.13-1.6 GM/177ML PO SOLN: 1.0000 | Freq: Once | ORAL | 0 refills | Status: AC

## 2022-07-20 NOTE — Telephone Encounter (Signed)
Pt returned call to schedule colonoscopy please return call 

## 2022-07-20 NOTE — Telephone Encounter (Signed)
Gastroenterology Pre-Procedure Review  Request Date: 08/31/2022 Requesting Physician: Dr. Tobi Bastos  PATIENT REVIEW QUESTIONS: The patient responded to the following health history questions as indicated:    1. Are you having any GI issues? no 2. Do you have a personal history of Polyps? no 3. Do you have a family history of Colon Cancer or Polyps? no 4. Diabetes Mellitus? no 5. Joint replacements in the past 12 months?no 6. Major health problems in the past 3 months?no 7. Any artificial heart valves, MVP, or defibrillator?no    MEDICATIONS & ALLERGIES:    Patient reports the following regarding taking any anticoagulation/antiplatelet therapy:   Plavix, Coumadin, Eliquis, Xarelto, Lovenox, Pradaxa, Brilinta, or Effient? no Aspirin? no  Patient confirms/reports the following medications:  Current Outpatient Medications  Medication Sig Dispense Refill   hydrochlorothiazide (HYDRODIURIL) 25 MG tablet Take 25 mg by mouth daily.     Omega-3 1000 MG CAPS Take by mouth.     rosuvastatin (CRESTOR) 5 MG tablet Take 5 mg by mouth daily.     No current facility-administered medications for this visit.    Patient confirms/reports the following allergies:  Allergies  Allergen Reactions   Sulfa Antibiotics Hives    No orders of the defined types were placed in this encounter.   AUTHORIZATION INFORMATION Primary Insurance: 1D#: Group #:  Secondary Insurance: 1D#: Group #:  SCHEDULE INFORMATION: Date: 08/31/2022 Time: Location:  [facility]

## 2022-07-20 NOTE — Telephone Encounter (Signed)
Colonoscopy schedule with Dr Tobi Bastos on 08/31/2022

## 2022-07-25 ENCOUNTER — Other Ambulatory Visit: Payer: Self-pay | Admitting: Physician Assistant

## 2022-07-25 DIAGNOSIS — Z13820 Encounter for screening for osteoporosis: Secondary | ICD-10-CM

## 2022-08-28 ENCOUNTER — Telehealth: Payer: Self-pay

## 2022-08-28 NOTE — Telephone Encounter (Signed)
PT called and cancelled colonoscopy will call back to reschedule

## 2022-08-28 NOTE — Telephone Encounter (Signed)
Spoken to patient and confirm the cancellation. Patient is unable to afford her portion of the colonoscopy.  Called endo unit to make the change.

## 2022-08-31 ENCOUNTER — Ambulatory Visit: Admission: RE | Admit: 2022-08-31 | Payer: Medicare HMO | Source: Home / Self Care | Admitting: Gastroenterology

## 2022-08-31 ENCOUNTER — Encounter: Admission: RE | Payer: Self-pay | Source: Home / Self Care

## 2022-08-31 SURGERY — COLONOSCOPY WITH PROPOFOL
Anesthesia: General

## 2022-10-10 ENCOUNTER — Telehealth: Payer: Self-pay | Admitting: Gastroenterology

## 2022-10-10 NOTE — Telephone Encounter (Signed)
Patient called in and left on the voicemail she wants to reschedule procedure. I call her back but she didn't answer I am unable to leave a voicemail.

## 2022-10-11 NOTE — Telephone Encounter (Signed)
Spoken to patient and she stated that she wants to wait after her appointment with pcp. She will call back.

## 2022-10-24 ENCOUNTER — Other Ambulatory Visit: Payer: Self-pay

## 2022-10-24 ENCOUNTER — Telehealth: Payer: Self-pay

## 2022-10-24 DIAGNOSIS — Z1211 Encounter for screening for malignant neoplasm of colon: Secondary | ICD-10-CM

## 2022-10-24 DIAGNOSIS — R195 Other fecal abnormalities: Secondary | ICD-10-CM

## 2022-10-24 MED ORDER — NA SULFATE-K SULFATE-MG SULF 17.5-3.13-1.6 GM/177ML PO SOLN: 1.0000 | Freq: Once | ORAL | 0 refills | Status: AC

## 2022-10-24 NOTE — Addendum Note (Signed)
Addended by: Avie Arenas on: 10/24/2022 10:53 AM   Modules accepted: Orders

## 2022-10-24 NOTE — Telephone Encounter (Signed)
Pt requesting call back to schedule colonoscopy.

## 2022-10-24 NOTE — Telephone Encounter (Signed)
Pt contacted office to schedule her colonoscopy.  This is from a previously scheduled colonoscopy.  Colonoscopy has been scheduled for 11/28/22.  Thanks, [place], [place]

## 2022-11-27 ENCOUNTER — Telehealth: Payer: Self-pay

## 2022-11-27 ENCOUNTER — Encounter: Payer: Self-pay | Admitting: Gastroenterology

## 2022-11-27 ENCOUNTER — Telehealth: Payer: Self-pay | Admitting: Gastroenterology

## 2022-11-27 NOTE — Telephone Encounter (Signed)
She wanted to know if she can take her blood pressure medication and her cholesterol medication.

## 2022-11-27 NOTE — Telephone Encounter (Signed)
Patient called in she has some question about her procedure tomorrow.

## 2022-11-27 NOTE — Telephone Encounter (Signed)
Patient has been advised to take cholesterol med this evening and take bp med in the am with just a sip of water.  She said Ginger had already called and advised as well.  Thanks,  [place], [place]

## 2022-11-28 ENCOUNTER — Ambulatory Visit
Admission: RE | Admit: 2022-11-28 | Discharge: 2022-11-28 | Disposition: A | Discharge status: Home / Self Care | Payer: Medicare HMO | Attending: Gastroenterology | Admitting: Gastroenterology

## 2022-11-28 ENCOUNTER — Encounter
Admission: RE | Disposition: A | Discharge status: Home / Self Care | Payer: Self-pay | Source: Home / Self Care | Attending: Gastroenterology

## 2022-11-28 ENCOUNTER — Encounter: Payer: Self-pay | Admitting: Gastroenterology

## 2022-11-28 ENCOUNTER — Ambulatory Visit: Payer: Medicare HMO

## 2022-11-28 DIAGNOSIS — D122 Benign neoplasm of ascending colon: Secondary | ICD-10-CM | POA: Diagnosis not present

## 2022-11-28 DIAGNOSIS — F17210 Nicotine dependence, cigarettes, uncomplicated: Secondary | ICD-10-CM | POA: Insufficient documentation

## 2022-11-28 DIAGNOSIS — D124 Benign neoplasm of descending colon: Secondary | ICD-10-CM | POA: Insufficient documentation

## 2022-11-28 DIAGNOSIS — I10 Essential (primary) hypertension: Secondary | ICD-10-CM | POA: Diagnosis not present

## 2022-11-28 DIAGNOSIS — K5730 Diverticulosis of large intestine without perforation or abscess without bleeding: Secondary | ICD-10-CM | POA: Diagnosis not present

## 2022-11-28 DIAGNOSIS — K640 First degree hemorrhoids: Secondary | ICD-10-CM | POA: Diagnosis not present

## 2022-11-28 DIAGNOSIS — Z1211 Encounter for screening for malignant neoplasm of colon: Secondary | ICD-10-CM | POA: Diagnosis not present

## 2022-11-28 DIAGNOSIS — R195 Other fecal abnormalities: Secondary | ICD-10-CM

## 2022-11-28 DIAGNOSIS — D126 Benign neoplasm of colon, unspecified: Secondary | ICD-10-CM

## 2022-11-28 HISTORY — PX: HEMOSTASIS CLIP PLACEMENT: SHX6857

## 2022-11-28 HISTORY — PX: POLYPECTOMY: SHX5525

## 2022-11-28 HISTORY — PX: COLONOSCOPY WITH PROPOFOL: SHX5780

## 2022-11-28 SURGERY — COLONOSCOPY WITH PROPOFOL
Anesthesia: General

## 2022-11-28 MED ORDER — PROPOFOL 500 MG/50ML IV EMUL
INTRAVENOUS | Status: DC | PRN
  Administered 2022-11-28: 150 ug/kg/min via INTRAVENOUS

## 2022-11-28 MED ORDER — SODIUM CHLORIDE 0.9 % IV SOLN: INTRAVENOUS | Status: DC

## 2022-11-28 MED ORDER — PROPOFOL 10 MG/ML IV BOLUS
INTRAVENOUS | Status: DC | PRN
  Administered 2022-11-28: 70 mg via INTRAVENOUS

## 2022-11-28 MED ORDER — DEXMEDETOMIDINE HCL IN NACL 80 MCG/20ML IV SOLN
INTRAVENOUS | Status: DC | PRN
  Administered 2022-11-28: 4 ug via INTRAVENOUS

## 2022-11-28 MED ORDER — LIDOCAINE HCL (CARDIAC) PF 100 MG/5ML IV SOSY
PREFILLED_SYRINGE | INTRAVENOUS | Status: DC | PRN
  Administered 2022-11-28: 50 mg via INTRAVENOUS

## 2022-11-28 NOTE — H&P (Signed)
     Wyline Mood, MD [address], [address], [place], [place], [address] [address], [address], [place], [place], [address] Phone: [phone]  Fax: [phone]  Primary Care Physician:  Gildardo Pounds, PA   Pre-Procedure History & Physical: HPI:  Rebecca Pennington is a 67 y.o. female is here for an colonoscopy.   Past Medical History:  Diagnosis Date   Hypertension     History reviewed. No pertinent surgical history.  Prior to Admission medications   Medication Sig Start Date End Date Taking? Authorizing Provider  clobetasol ointment (TEMOVATE) 0.05 % APPLY TO THE AFFECTED AREA AS NEEDED 06/15/20   [provider]  hydrochlorothiazide (HYDRODIURIL) 25 MG tablet Take 25 mg by mouth daily.    [provider]  Omega-3 1000 MG CAPS Take by mouth.    [provider]  rosuvastatin (CRESTOR) 5 MG tablet Take 5 mg by mouth daily.    [provider]    Allergies as of 10/24/2022 - Review Complete 10/24/2022  Allergen Reaction Noted   Other Hives 10/05/2014   Sulfa antibiotics Hives 10/05/2014    Family History  Problem Relation Age of Onset   Breast cancer Neg Hx     Social History   Socioeconomic History   Marital status: Legally Separated    Spouse name: Not on file   Number of children: Not on file   Years of education: Not on file   Highest education level: Not on file  Occupational History   Not on file  Tobacco Use   Smoking status: Every Day    Types: Cigarettes   Smokeless tobacco: Never  Substance and Sexual Activity   Alcohol use: Yes   Drug use: Not Currently   Sexual activity: Not on file  Other Topics Concern   Not on file  Social History Narrative   Not on file   Social Determinants of Health   Financial Resource Strain: Not on file  Food Insecurity: Not on file  Transportation Needs: Not on file  Physical Activity: Not on file  Stress: Not on file  Social Connections: Not on file  Intimate Partner  Violence: Not on file    Review of Systems: See HPI, otherwise negative ROS  Physical Exam: LMP 11/13/2012 (Approximate)  General:   Alert,  pleasant and cooperative in NAD Head:  Normocephalic and atraumatic. Neck:  Supple; no masses or thyromegaly. Lungs:  Clear throughout to auscultation, normal respiratory effort.    Heart:  +S1, +S2, Regular rate and rhythm, No edema. Abdomen:  Soft, nontender and nondistended. Normal bowel sounds, without guarding, and without rebound.   Neurologic:  Alert and  oriented x4;  grossly normal neurologically.  Impression/Plan: Rebecca Pennington is here for an colonoscopy to be performed for positive cologuard Risks, benefits, limitations, and alternatives regarding  colonoscopy have been reviewed with the patient.  Questions have been answered.  All parties agreeable.   Wyline Mood, MD  11/28/2022, 8:18 AM

## 2022-11-28 NOTE — Anesthesia Postprocedure Evaluation (Signed)
Anesthesia Post Note  Patient: AZIE MCCONAHY  Procedure(s) Performed: COLONOSCOPY WITH PROPOFOL POLYPECTOMY  Patient location during evaluation: Endoscopy Anesthesia Type: General Level of consciousness: awake and alert Pain management: pain level controlled Vital Signs Assessment: post-procedure vital signs reviewed and stable Respiratory status: spontaneous breathing, nonlabored ventilation and respiratory function stable Cardiovascular status: blood pressure returned to baseline and stable Postop Assessment: no apparent nausea or vomiting Anesthetic complications: no   No notable events documented.   Last Vitals:  Vitals:   11/28/22 0846 11/28/22 0937  BP: (!) 147/90 95/62  Pulse: 69   Resp: 18   Temp: (!) 36.3 C (!) 36.2 C  SpO2: 100%     Last Pain:  Vitals:   11/28/22 0957  TempSrc:   PainSc: 0-No pain                 Foye Deer

## 2022-11-28 NOTE — Anesthesia Preprocedure Evaluation (Addendum)
Anesthesia Evaluation  Patient identified by MRN, date of birth, ID band Patient awake    Reviewed: Allergy & Precautions, H&P , NPO status , Patient's Chart, lab work & pertinent test results  Airway Mallampati: III  TM Distance: >3 FB Neck ROM: full    Dental no notable dental hx.    Pulmonary neg pulmonary ROS   Pulmonary exam normal        Cardiovascular hypertension, Normal cardiovascular exam     Neuro/Psych  PSYCHIATRIC DISORDERS Anxiety     negative neurological ROS     GI/Hepatic negative GI ROS, Neg liver ROS,,,  Endo/Other  negative endocrine ROS    Renal/GU negative Renal ROS  negative genitourinary   Musculoskeletal   Abdominal Normal abdominal exam  (+)   Peds  Hematology negative hematology ROS (+)   Anesthesia Other Findings Past Medical History: No date: Hypertension    Reproductive/Obstetrics negative OB ROS                             Anesthesia Physical Anesthesia Plan  ASA: 2  Anesthesia Plan: General   Post-op Pain Management: Minimal or no pain anticipated   Induction: Intravenous  PONV Risk Score and Plan: Propofol infusion and TIVA  Airway Management Planned: Natural Airway  Additional Equipment:   Intra-op Plan:   Post-operative Plan:   Informed Consent: I have reviewed the patients History and Physical, chart, labs and discussed the procedure including the risks, benefits and alternatives for the proposed anesthesia with the patient or authorized representative who has indicated his/her understanding and acceptance.     Dental Advisory Given  Plan Discussed with: CRNA and Surgeon  Anesthesia Plan Comments:         Anesthesia Quick Evaluation

## 2022-11-28 NOTE — Op Note (Signed)
[facility] Patient Name: Rebecca Pennington Procedure Date: 11/28/2022 9:00 AM MRN: [id number] Account #: [id number] Date of Birth: 29-Dec-1955 Admit Type: Outpatient Age: 67 Room: [facility] ENDO ROOM 2 Gender: Female Note Status: Finalized Instrument Name: Prentice Docker [id number] Procedure:             Colonoscopy Indications:           Screening for colorectal malignant neoplasm due to                         positive Cologuard test Providers:             Wyline Mood MD, MD Referring MD:          No Local Md, MD (Referring MD) Medicines:             Propofol per Anesthesia, Monitored Anesthesia Care Complications:         No immediate complications. Procedure:             Pre-Anesthesia Assessment:                        - Prior to the procedure, a History and Physical was                         performed, and patient medications, allergies and                         sensitivities were reviewed. The patient's tolerance                         of previous anesthesia was reviewed.                        - The risks and benefits of the procedure and the                         sedation options and risks were discussed with the                         patient. All questions were answered and informed                         consent was obtained.                        - ASA Grade Assessment: II - A patient with mild                         systemic disease.                        After obtaining informed consent, the colonoscope was                         passed under direct vision. Throughout the procedure,                         the patient's blood pressure, pulse, and oxygen  saturations were monitored continuously. The                         Colonoscope was introduced through the anus and                         advanced to the the cecum, identified by the                         appendiceal orifice. The colonoscopy was  performed                         with ease. The patient tolerated the procedure well.                         The quality of the bowel preparation was excellent.                         The ileocecal valve, appendiceal orifice, and rectum                         were photographed. Findings:      The perianal and digital rectal examinations were normal.      Non-bleeding internal hemorrhoids were found during retroflexion. The       hemorrhoids were medium-sized and Grade I (internal hemorrhoids that do       not prolapse).      Multiple medium-mouthed diverticula were found in the left colon.      Four sessile polyps were found in the ascending colon. The polyps were 7       to 9 mm in size. These polyps were removed with a cold snare. Resection       and retrieval were complete. To prevent bleeding post-intervention, one       hemostatic clip was successfully placed. Clip manufacturer: [employer]. There was no bleeding at the end of the procedure.      Two sessile polyps were found in the descending colon. The polyps were 4       to 5 mm in size. These polyps were removed with a cold snare. Resection       and retrieval were complete.      The exam was otherwise without abnormality on direct and retroflexion       views. Impression:            - Non-bleeding internal hemorrhoids.                        - Diverticulosis in the left colon.                        - Four 7 to 9 mm polyps in the ascending colon,                         removed with a cold snare. Resected and retrieved.                         Clip was placed. Clip manufacturer: [employer].                        -  Two 4 to 5 mm polyps in the descending colon,                         removed with a cold snare. Resected and retrieved.                        - The examination was otherwise normal on direct and                         retroflexion views. Recommendation:        - Discharge patient to home  (with escort).                        - Resume previous diet.                        - Continue present medications.                        - Await pathology results.                        - Repeat colonoscopy in 3 years for surveillance. Procedure Code(s):     --- Professional ---                        [phone], Colonoscopy, flexible; with removal of                         tumor(s), polyp(s), or other lesion(s) by snare                         technique Diagnosis Code(s):     --- Professional ---                        Z12.11, Encounter for screening for malignant neoplasm                         of colon                        R19.5, Other fecal abnormalities                        K64.0, First degree hemorrhoids                        D12.2, Benign neoplasm of ascending colon                        D12.4, Benign neoplasm of descending colon                        K57.30, Diverticulosis of large intestine without                         perforation or abscess without bleeding CPT copyright 2022 [employer]. All rights reserved. The codes documented in this report are preliminary and upon coder review may  be revised to meet current compliance requirements. Wyline Mood, MD Wyline Mood MD, MD 11/28/2022 9:34:33 AM This report has  been signed electronically. Number of Addenda: 0 Note Initiated On: 11/28/2022 9:00 AM Scope Withdrawal Time: 0 hours 12 minutes 6 seconds  Total Procedure Duration: 0 hours 18 minutes 34 seconds  Estimated Blood Loss:  Estimated blood loss: none.      [facility]

## 2022-11-28 NOTE — Anesthesia Procedure Notes (Signed)
Procedure Name: MAC Date/Time: 11/28/2022 9:10 AM  Performed by: Hezzie Bump, CRNAPre-anesthesia Checklist: Patient identified, Emergency Drugs available, Suction available and Patient being monitored Patient Re-evaluated:Patient Re-evaluated prior to induction Oxygen Delivery Method: Nasal cannula Induction Type: IV induction Placement Confirmation: positive ETCO2

## 2022-11-28 NOTE — Transfer of Care (Signed)
Immediate Anesthesia Transfer of Care Note  Patient: Rebecca Pennington  Procedure(s) Performed: COLONOSCOPY WITH PROPOFOL POLYPECTOMY  Patient Location: Endoscopy Unit  Anesthesia Type:General  Level of Consciousness: awake, alert , and oriented  Airway & Oxygen Therapy: Patient Spontanous Breathing  Post-op Assessment: Report given to RN and Post -op Vital signs reviewed and stable  Post vital signs: Reviewed and stable  Last Vitals:  Vitals Value Taken Time  BP 95/62 11/28/22 0937  Temp    Pulse 69 11/28/22 0938  Resp 14 11/28/22 0938  SpO2 100 % 11/28/22 0938  Vitals shown include unfiled device data.  Last Pain:  Vitals:   11/28/22 0937  TempSrc: Temporal  PainSc:          Complications: No notable events documented.

## 2022-11-29 ENCOUNTER — Encounter: Payer: Self-pay | Admitting: Gastroenterology

## 2022-11-29 LAB — SURGICAL PATHOLOGY

## 2022-11-30 ENCOUNTER — Encounter: Payer: Self-pay | Admitting: Gastroenterology

## 2023-05-08 ENCOUNTER — Other Ambulatory Visit: Payer: Self-pay | Admitting: Physician Assistant

## 2023-05-08 DIAGNOSIS — Z1231 Encounter for screening mammogram for malignant neoplasm of breast: Secondary | ICD-10-CM

## 2023-06-26 ENCOUNTER — Ambulatory Visit
Admission: RE | Admit: 2023-06-26 | Discharge: 2023-06-26 | Disposition: A | Discharge status: Home / Self Care | Source: Ambulatory Visit | Attending: Physician Assistant | Admitting: Physician Assistant

## 2023-06-26 DIAGNOSIS — Z13820 Encounter for screening for osteoporosis: Secondary | ICD-10-CM | POA: Diagnosis present

## 2023-06-26 DIAGNOSIS — Z1231 Encounter for screening mammogram for malignant neoplasm of breast: Secondary | ICD-10-CM | POA: Diagnosis present

## 2023-06-26 DIAGNOSIS — Z780 Asymptomatic menopausal state: Secondary | ICD-10-CM | POA: Insufficient documentation

## 2023-06-26 DIAGNOSIS — M8588 Other specified disorders of bone density and structure, other site: Secondary | ICD-10-CM | POA: Insufficient documentation
